# Patient Record
Sex: Male | Born: 2000 | Race: White | Hispanic: No | Marital: Single
Health system: Southern US, Community
[De-identification: ages and names within clinical notes are randomized; demographics above are authoritative.]

## PROBLEM LIST (undated history)

## (undated) DIAGNOSIS — R278 Other lack of coordination: Secondary | ICD-10-CM

## (undated) DIAGNOSIS — F902 Attention-deficit hyperactivity disorder, combined type: Secondary | ICD-10-CM

## (undated) HISTORY — PX: ADENOIDECTOMY: SUR15

## (undated) HISTORY — PX: TYMPANOSTOMY TUBE PLACEMENT: SHX32

## (undated) HISTORY — DX: Attention-deficit hyperactivity disorder, combined type: F90.2

## (undated) HISTORY — PX: WISDOM TOOTH EXTRACTION: SHX21

## (undated) HISTORY — DX: Other lack of coordination: R27.8

---
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
## 2021-01-05 DIAGNOSIS — F4011 Social phobia, generalized: Secondary | ICD-10-CM | POA: Diagnosis not present

## 2021-01-05 DIAGNOSIS — F902 Attention-deficit hyperactivity disorder, combined type: Secondary | ICD-10-CM | POA: Diagnosis not present

## 2021-01-19 DIAGNOSIS — F4011 Social phobia, generalized: Secondary | ICD-10-CM | POA: Diagnosis not present

## 2021-01-19 DIAGNOSIS — F902 Attention-deficit hyperactivity disorder, combined type: Secondary | ICD-10-CM | POA: Diagnosis not present

## 2021-02-09 DIAGNOSIS — F902 Attention-deficit hyperactivity disorder, combined type: Secondary | ICD-10-CM | POA: Diagnosis not present

## 2021-02-09 DIAGNOSIS — F4011 Social phobia, generalized: Secondary | ICD-10-CM | POA: Diagnosis not present

## 2021-03-10 DIAGNOSIS — F4011 Social phobia, generalized: Secondary | ICD-10-CM | POA: Diagnosis not present

## 2021-03-10 DIAGNOSIS — F902 Attention-deficit hyperactivity disorder, combined type: Secondary | ICD-10-CM | POA: Diagnosis not present

## 2021-04-15 DIAGNOSIS — F902 Attention-deficit hyperactivity disorder, combined type: Secondary | ICD-10-CM | POA: Diagnosis not present

## 2021-04-15 DIAGNOSIS — F4011 Social phobia, generalized: Secondary | ICD-10-CM | POA: Diagnosis not present

## 2021-06-02 ENCOUNTER — Telehealth: Payer: Self-pay | Admitting: Internal Medicine

## 2021-06-02 NOTE — Telephone Encounter (Signed)
Appointment entered

## 2021-06-02 NOTE — Telephone Encounter (Signed)
Needs appt at 310 Mon 8/8 - having GERD issues - about to go to college Mom - Dr. Penne Lash texted and they are aware of the appointment

## 2021-06-03 DIAGNOSIS — F4011 Social phobia, generalized: Secondary | ICD-10-CM | POA: Diagnosis not present

## 2021-06-03 DIAGNOSIS — F902 Attention-deficit hyperactivity disorder, combined type: Secondary | ICD-10-CM | POA: Diagnosis not present

## 2021-06-06 ENCOUNTER — Ambulatory Visit (INDEPENDENT_AMBULATORY_CARE_PROVIDER_SITE_OTHER): Payer: BC Managed Care – PPO | Admitting: Internal Medicine

## 2021-06-06 ENCOUNTER — Encounter: Payer: Self-pay | Admitting: Internal Medicine

## 2021-06-06 VITALS — BP 84/56 | HR 72 | Ht 67.5 in | Wt 164.0 lb

## 2021-06-06 DIAGNOSIS — R059 Cough, unspecified: Secondary | ICD-10-CM

## 2021-06-06 NOTE — Progress Notes (Signed)
   Derek Barry 19 y.o. [birth year] [id number]  Assessment & Plan:   Encounter Diagnosis  Name Primary?   Cough after eating Yes   The response to PPI suggest that it could be GERD.  It would be unusual but I suppose possible that EOE could do this.  I am going to have him do a barium swallow and we will make further treatment plans after that.  I suspect going to higher dose PPI versus twice daily 20 mg omeprazole might be the next step.  I had intended to give him a GERD diet handout which we did not do I can send that through MyChart we did briefly discussed limiting caffeine chocolate citrus and tomato-based foods.  Again I am not 100% convinced this is true reflux.  Once I get the barium swallow back we can decide.   Subjective:   Chief Complaint: Cough after eating  HPI The patient is an 20 year old white man who has had problems over the past several months or longer where he has had coughing after eating large meals or drinking a lot.  His pediatrician placed him on omeprazole which we think was the 20 mg dose and it was working but lately he has had some breakthrough symptoms.  He is getting ready to go off to the [employer] in a few days to start college.  His mother had contacted me about these problems and we made this appointment.  He has no dysphagia.  There are no allergy symptoms or postnasal drip.  The cough may be slightly wet when he has it. No Known Allergies Current Meds  Medication Sig   amphetamine-dextroamphetamine (ADDERALL) 20 MG tablet Take 1 tablet by mouth 2 (two) times daily.   clindamycin-benzoyl peroxide (BENZACLIN) gel    sertraline (ZOLOFT) 100 MG tablet TAKE 1 TABLET (100 MG TOTAL) BY MOUTH DAILY.       Past Medical History:  Diagnosis Date   ADHD (attention deficit hyperactivity disorder), combined type 02/01/2016   Dysgraphia 02/01/2016   Past Surgical History:  Procedure Laterality Date   TONSILLECTOMY AND ADENOIDECTOMY   10/31/2007   TYMPANOSTOMY TUBE PLACEMENT     WISDOM TOOTH EXTRACTION     Social History   Social History Narrative   Single no children   Mom Dr. Penne Lash   1 caffeinated drink some days Coke 0   Never smoker no drug use no tobacco   family history includes Celiac disease in his sister; Hypertension in his maternal grandfather and paternal grandfather; Lymphoma (age of onset: 22) in his paternal grandmother; Melanoma in his maternal grandfather; Stroke in his maternal grandfather.   Review of Systems All other review of systems negative  Objective:   Physical Exam BP (!) 84/56 (BP Location: Left Arm, Patient Position: Sitting, Cuff Size: Normal)   Pulse 72   Ht 5' 7.5" (1.715 m) Comment: height measured without shoes  Wt 164 lb (74.4 kg)   BMI 25.31 kg/m  Well-developed well-nourished young white man no acute distress Lungs clear Pharynx is clear Heart sounds are normal S1-S2 no rubs murmurs or gallops Abdomen is soft and nontender without organomegaly or mass Appropriate mood and affect

## 2021-06-06 NOTE — Patient Instructions (Signed)
You have been scheduled for a Barium Esophogram at [facility] (1st floor of the hospital) on 06/10/21 at 11:00am. Please arrive 15 minutes prior to your appointment for registration. Make certain not to have anything to eat or drink 3 hours prior to your test. If you need to reschedule for any reason, please contact radiology at [phone] to do so. __________________________________________________________________ A barium swallow is an examination that concentrates on views of the esophagus. This tends to be a double contrast exam (barium and two liquids which, when combined, create a gas to distend the wall of the oesophagus) or single contrast (non-ionic iodine based). The study is usually tailored to your symptoms so a good history is essential. Attention is paid during the study to the form, structure and configuration of the esophagus, looking for functional disorders (such as aspiration, dysphagia, achalasia, motility and reflux) EXAMINATION You may be asked to change into a gown, depending on the type of swallow being performed. A radiologist and radiographer will perform the procedure. The radiologist will advise you of the type of contrast selected for your procedure and direct you during the exam. You will be asked to stand, sit or lie in several different positions and to hold a small amount of fluid in your mouth before being asked to swallow while the imaging is performed .In some instances you may be asked to swallow barium coated marshmallows to assess the motility of a solid food bolus. The exam can be recorded as a digital or video fluoroscopy procedure. POST PROCEDURE It will take 1-2 days for the barium to pass through your system. To facilitate this, it is important, unless otherwise directed, to increase your fluids for the next 24-48hrs and to resume your normal diet.  This test typically takes about 30 minutes to  perform. __________________________________________________________________________________  I appreciate the opportunity to care for you. Stan Head, MD, [facility]

## 2021-06-06 NOTE — Progress Notes (Signed)
Phone: [phone]   Subjective:  Patient presents today to establish care and for annual physical.  Prior patient of Dr. Hyacinth [facility] pediatrics.  Chief Complaint  Patient presents with   New Patient (Initial Visit)    Establishing care    See problem oriented charting  The following were reviewed and entered/updated in epic: Past Medical History:  Diagnosis Date   ADHD (attention deficit hyperactivity disorder), combined type 02/01/2016   Patient Active Problem List   Diagnosis Date Noted   Cough after eating 06/06/2021    Priority: Medium   Generalized anxiety disorder 05/03/2016    Priority: Medium   Attention deficit disorder (ADD) in adult 02/01/2016    Priority: Medium   Past Surgical History:  Procedure Laterality Date   TONSILLECTOMY AND ADENOIDECTOMY  10/31/2007   TYMPANOSTOMY TUBE PLACEMENT     believes at younger age   WISDOM TOOTH EXTRACTION      Family History  Problem Relation Age of Onset   Healthy Mother    Healthy Father    Celiac disease Sister    Healthy Brother    Dementia Maternal Grandmother    Stroke Maternal Grandfather    Hypertension Maternal Grandfather    Melanoma Maternal Grandfather    Prostate cancer Maternal Grandfather        ? age still living at 19 per patient   Lymphoma Paternal Grandmother 63   Hypertension Paternal Grandfather    Heart attack Paternal Grandfather        passed in 42s    Medications- reviewed and updated Current Outpatient Medications  Medication Sig Dispense Refill   amphetamine-dextroamphetamine (ADDERALL) 20 MG tablet Take 1 tablet by mouth 2 (two) times daily.     clindamycin-benzoyl peroxide (BENZACLIN) gel   6   DOXYCYCLINE PO Take by mouth daily. Patient is unsure of the dosage.     sertraline (ZOLOFT) 100 MG tablet TAKE 1 TABLET (100 MG TOTAL) BY MOUTH DAILY. 90 tablet 0   No current facility-administered medications for this visit.    Allergies-reviewed and updated No Known  Allergies  Social History   Social History Narrative   Single. At home with mom Dr. Penne Lash of ob/gyn,  dad, brother Romeo Apple 53, sister Alan Ripper 12 in 2022.       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  HENT:  Negative for ear discharge, ear pain and hearing loss.   Eyes:  Negative for blurred vision and double vision.  Respiratory:  Positive for cough (after eating- working with Dr. Leone Payor). Negative for shortness of breath.   Cardiovascular:  Negative for chest pain and palpitations.  Gastrointestinal:  Negative for abdominal pain, constipation, diarrhea, heartburn, nausea and vomiting.  Genitourinary:  Negative for dysuria and frequency.  Musculoskeletal:  Negative for back pain, joint pain and myalgias.  Skin:  Negative for itching and rash.  Neurological:  Negative for dizziness and headaches.  Endo/Heme/Allergies:  Negative for polydipsia. Does not bruise/bleed easily.  Psychiatric/Behavioral:  Negative for depression and suicidal ideas.    The following were reviewed and entered/updated in epic: Past Medical History:  Diagnosis Date   ADHD (attention  deficit hyperactivity disorder), combined type 02/01/2016   Patient Active Problem List   Diagnosis Date Noted   Cough after eating 06/06/2021    Priority: Medium   Generalized anxiety disorder 05/03/2016    Priority: Medium   Attention deficit disorder (ADD) in adult 02/01/2016    Priority: Medium   Past Surgical History:  Procedure Laterality Date   TONSILLECTOMY AND ADENOIDECTOMY  10/31/2007   TYMPANOSTOMY TUBE PLACEMENT     believes at younger age   WISDOM TOOTH EXTRACTION       Family History  Problem Relation Age of Onset   Healthy Mother    Healthy Father    Celiac disease Sister    Healthy Brother    Dementia Maternal Grandmother    Stroke Maternal Grandfather    Hypertension Maternal Grandfather    Melanoma Maternal Grandfather    Prostate cancer Maternal Grandfather        ? age still living at 68 per patient   Lymphoma Paternal Grandmother 25   Hypertension Paternal Grandfather    Heart attack Paternal Grandfather        passed in 3s    Medications- reviewed and updated Current Outpatient Medications  Medication Sig Dispense Refill   amphetamine-dextroamphetamine (ADDERALL) 20 MG tablet Take 1 tablet by mouth 2 (two) times daily.     clindamycin-benzoyl peroxide (BENZACLIN) gel   6   DOXYCYCLINE PO Take by mouth daily. Patient is unsure of the dosage.     sertraline (ZOLOFT) 100 MG tablet TAKE 1 TABLET (100 MG TOTAL) BY MOUTH DAILY. 90 tablet 0   No current facility-administered medications for this visit.    Allergies-reviewed and updated No Known Allergies  Social History   Social History Narrative   Single. At home with mom Dr. Penne Lash of ob/gyn,  dad, brother Romeo Apple 33, sister Alan Ripper 12 in 2022.       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 Oropharynx normal Neck: no thyromegaly CV: RRR no murmurs rubs or gallops Lungs: CTAB no crackles, wheeze, rhonchi Abdomen: soft/nontender/nondistended/normal bowel sounds. No rebound or guarding.  Ext: no edema Skin: warm,  dry Neuro: grossly normal, moves all extremities, PERRLA     Assessment and Plan:  20 y.o. male presenting for annual physical.  Health Maintenance counseling: 1. Anticipatory guidance: Patient counseled regarding regular dental exams -q6 months Dr. 15, eye exams - yearly,  avoiding smoking and second hand smoke , limiting alcohol to 2 beverages per day- not drinking at present which is ideal due to age under 55.   2. Risk factor reduction:  Advised patient of need for regular exercise and diet rich and fruits and vegetables to reduce risk of heart attack and stroke. Exercise- no regular regimen- enjoys walks and bike rides- encouraged to restart. Diet-reasonably healthy.  Wt Readings from Last 3 Encounters:  06/08/21 163 lb 3.2 oz (74 kg) (68 %, Z= 0.47)*  06/06/21 164  lb (74.4 kg) (69 %, Z= 0.50)*  12/15/19 149 lb (67.6 kg) (59 %, Z= 0.23)*   * Growth percentiles are based on CDC (Boys, 2-20 Years) data.  3. Immunizations/screenings/ancillary studies- up to date- will try to get copy of HPV likely through [facility] Immunization History  Administered Date(s) Administered   Influenza,inj,Quad PF,6+ Mos 10/28/2014, 09/07/2015, 08/03/2016, 07/13/2017   PFIZER(Purple Top)SARS-COV-2 Vaccination 01/16/2020, 02/10/2020, 10/19/2020  4. Prostate cancer screening- no family history in first degree relative (grandfather had prostate cancer), start at age 32  5. Colon cancer screening - no family history, start at age 62 6. Skin cancer screening/prevention- no dermatologist. advised regular sunscreen use. Denies worrisome, changing, or new skin lesions.  7. Testicular cancer screening- advised monthly self exams  8. STD screening- patient opts out- abstinence 9. Never smoker-   Status of chronic or acute concerns   #Social update-recently got back from a trip to [place] which he enjoyed   # cough S:Patient was presented for an office visit with Dr. Leone Payor for an evaluation of a cough on  06/06/2021. He reported to have problems over the past several months or longer with coughing after eating large meals or drinking a lot. The cough is slightly wet. His pediatrician placed him on omeprazole, which they thought was working but had some breakthrough symptoms. Denied dysphagia or allergy symptoms or postnasal drip.   A/P: Possible GERD?  Patient has been set up with a barium swallow study for Friday to obtain more information-there is some consideration of increasing omeprazole-we will await expert consult/information from Dr. Leone Payor  # cervical myofascial strain- related to backpacking as an [employer]-in 2021-no recent issues  #Acne-patient states currently on doxycycline but has not picked up his BenzaClin recently-we discussed typically these (benzyl peroxide and antibiotic) are used in combination to reduce antibiotic resistance-recommended he restart BenzaClin-I am open to sending these in if needed but he did not need these today  #GAD-well managed by psychiatry-patient reports doing well on Zoloft 100 mg  #ADD-also managed by psychiatry in [place]-patient reports good control on Adderall twice daily 20 mg  Recommended follow up: Return in about 1 year (around 06/08/2022) for physical or sooner if needed. Future Appointments  Date Time Provider Department Center  06/10/2021 11:00 AM [facility] 1 [facility] [place]    Lab/Order associations: fasting   ICD-10-CM   1. Preventative health care  Z00.00     2. Generalized anxiety disorder  F41.1     3. Attention deficit disorder (ADD) in adult  F98.8       Return precautions advised.  Tana Conch, MD

## 2021-06-08 ENCOUNTER — Other Ambulatory Visit: Payer: Self-pay

## 2021-06-08 ENCOUNTER — Encounter: Payer: Self-pay | Admitting: Family Medicine

## 2021-06-08 ENCOUNTER — Ambulatory Visit (INDEPENDENT_AMBULATORY_CARE_PROVIDER_SITE_OTHER): Payer: BC Managed Care – PPO | Admitting: Family Medicine

## 2021-06-08 VITALS — BP 97/61 | HR 67 | Temp 98.7°F | Ht 68.0 in | Wt 163.2 lb

## 2021-06-08 DIAGNOSIS — F411 Generalized anxiety disorder: Secondary | ICD-10-CM | POA: Diagnosis not present

## 2021-06-08 DIAGNOSIS — Z0000 Encounter for general adult medical examination without abnormal findings: Secondary | ICD-10-CM

## 2021-06-08 DIAGNOSIS — F988 Other specified behavioral and emotional disorders with onset usually occurring in childhood and adolescence: Secondary | ICD-10-CM

## 2021-06-08 NOTE — Patient Instructions (Addendum)
Health Maintenance Due  Topic Date Due   HPV VACCINES (1 - Male 2-dose series)   Team please check on this in [facility] Never done   Hepatitis C Screening  Consider if we do labs in future Never done   INFLUENZA VACCINE   Would recommend in the fall- please let us know when you get this 05/30/2021   Great to meet you! Wish you the best in [place]  Recommended follow up: Return in about 1 year (around 06/08/2022) for physical or sooner if needed.

## 2021-06-10 ENCOUNTER — Ambulatory Visit (HOSPITAL_COMMUNITY)
Admission: RE | Admit: 2021-06-10 | Discharge: 2021-06-10 | Disposition: A | Discharge status: Home / Self Care | Payer: BC Managed Care – PPO | Source: Ambulatory Visit | Attending: Internal Medicine | Admitting: Internal Medicine

## 2021-06-10 ENCOUNTER — Other Ambulatory Visit (HOSPITAL_COMMUNITY): Payer: Self-pay

## 2021-06-10 ENCOUNTER — Other Ambulatory Visit: Payer: Self-pay

## 2021-06-10 ENCOUNTER — Other Ambulatory Visit: Payer: Self-pay | Admitting: Internal Medicine

## 2021-06-10 DIAGNOSIS — K449 Diaphragmatic hernia without obstruction or gangrene: Secondary | ICD-10-CM | POA: Diagnosis not present

## 2021-06-10 DIAGNOSIS — R059 Cough, unspecified: Secondary | ICD-10-CM | POA: Insufficient documentation

## 2021-06-10 DIAGNOSIS — K219 Gastro-esophageal reflux disease without esophagitis: Secondary | ICD-10-CM | POA: Diagnosis not present

## 2021-06-10 MED ORDER — OMEPRAZOLE 40 MG PO CPDR
40.0000 mg | DELAYED_RELEASE_CAPSULE | Freq: Every day | ORAL | 3 refills | Status: DC
  Filled 2021-06-10: qty 30, 30d supply, fill #0

## 2021-06-13 ENCOUNTER — Other Ambulatory Visit (HOSPITAL_COMMUNITY): Payer: Self-pay

## 2021-09-25 ENCOUNTER — Encounter: Payer: Self-pay | Admitting: Family Medicine

## 2021-09-27 ENCOUNTER — Other Ambulatory Visit: Payer: Self-pay

## 2021-09-27 MED ORDER — OMEPRAZOLE 40 MG PO CPDR: 40.0000 mg | DELAYED_RELEASE_CAPSULE | Freq: Every day | ORAL | 3 refills | Status: DC

## 2021-10-27 DIAGNOSIS — F902 Attention-deficit hyperactivity disorder, combined type: Secondary | ICD-10-CM | POA: Diagnosis not present

## 2021-10-27 DIAGNOSIS — F4011 Social phobia, generalized: Secondary | ICD-10-CM | POA: Diagnosis not present

## 2021-10-28 DIAGNOSIS — Z13811 Encounter for screening for lower gastrointestinal disorder: Secondary | ICD-10-CM | POA: Diagnosis not present

## 2021-10-28 DIAGNOSIS — Z8379 Family history of other diseases of the digestive system: Secondary | ICD-10-CM | POA: Diagnosis not present

## 2021-11-07 DIAGNOSIS — Z13811 Encounter for screening for lower gastrointestinal disorder: Secondary | ICD-10-CM | POA: Diagnosis not present

## 2021-11-07 DIAGNOSIS — Z8379 Family history of other diseases of the digestive system: Secondary | ICD-10-CM | POA: Diagnosis not present

## 2022-03-20 ENCOUNTER — Ambulatory Visit: Payer: BC Managed Care – PPO | Admitting: Podiatry

## 2022-03-21 ENCOUNTER — Other Ambulatory Visit (HOSPITAL_COMMUNITY): Payer: Self-pay

## 2022-03-21 ENCOUNTER — Ambulatory Visit (INDEPENDENT_AMBULATORY_CARE_PROVIDER_SITE_OTHER): Payer: BC Managed Care – PPO | Admitting: Podiatry

## 2022-03-21 ENCOUNTER — Encounter: Payer: Self-pay | Admitting: Podiatry

## 2022-03-21 DIAGNOSIS — H52202 Unspecified astigmatism, left eye: Secondary | ICD-10-CM | POA: Diagnosis not present

## 2022-03-21 DIAGNOSIS — L600 Ingrowing nail: Secondary | ICD-10-CM

## 2022-03-21 MED ORDER — DOXYCYCLINE HYCLATE 100 MG PO TABS
100.0000 mg | ORAL_TABLET | Freq: Two times a day (BID) | ORAL | 0 refills | Status: DC
  Filled 2022-03-21: qty 20, 10d supply, fill #0

## 2022-03-21 NOTE — Patient Instructions (Signed)

## 2022-03-21 NOTE — Progress Notes (Addendum)
   Subjective: Patient presents today for evaluation of pain to the lateral border left great toe. Patient is concerned for possible ingrown nail.  It is very sensitive to touch.  Patient presents today for further treatment and evaluation.  Past Medical History:  Diagnosis Date   ADHD (attention deficit hyperactivity disorder), combined type 02/01/2016    Objective:  General: Well developed, nourished, in no acute distress, alert and oriented x3   Dermatology: Skin is warm, dry and supple bilateral.  Lateral border left great toe appears to be erythematous with evidence of an ingrowing nail. Pain on palpation noted to the border of the nail fold. The remaining nails appear unremarkable at this time. There are no open sores, lesions.  Vascular: Dorsalis Pedis artery and Posterior Tibial artery pedal pulses palpable. No lower extremity edema noted.   Neruologic: Grossly intact via light touch bilateral.  Musculoskeletal: Muscular strength within normal limits in all groups bilateral. Normal range of motion noted to all pedal and ankle joints.   Assesement: #1 Paronychia with ingrowing nail lateral border left great toe #2 Pain in toe  Plan of Care:  1. Patient evaluated.  2. Discussed treatment alternatives and plan of care. Explained nail avulsion procedure and post procedure course to patient. 3. Patient opted for permanent partial nail avulsion of the ingrown portion of the nail.  4. Prior to procedure, local anesthesia infiltration utilized using 3 ml of a 50:50 mixture of 2% plain lidocaine and 0.5% plain marcaine in a normal hallux block fashion and a betadine prep performed.  5. Partial permanent nail avulsion with chemical matrixectomy performed using 3x30sec applications of phenol followed by alcohol flush.  6. Light dressing applied.  Post care instructions provided 7.  Patient has a prescription for gentamicin 2% cream at home.  Apply daily with a Band-Aid 8.  Prescription  for doxycycline 100 mg 2 times daily #20  9.  Return to clinic 2 weeks.  Felecia Shelling, DPM [facility]  Dr. Felecia Shelling, DPM    [address] [place], [place] [address]                Office [phone]  Fax [phone]

## 2022-03-22 ENCOUNTER — Encounter: Payer: Self-pay | Admitting: Family Medicine

## 2022-03-22 ENCOUNTER — Ambulatory Visit (INDEPENDENT_AMBULATORY_CARE_PROVIDER_SITE_OTHER): Payer: BC Managed Care – PPO | Admitting: Family Medicine

## 2022-03-22 DIAGNOSIS — L700 Acne vulgaris: Secondary | ICD-10-CM

## 2022-03-22 DIAGNOSIS — L709 Acne, unspecified: Secondary | ICD-10-CM | POA: Insufficient documentation

## 2022-03-22 MED ORDER — CLINDAMYCIN PHOS-BENZOYL PEROX 1-5 % EX GEL: Freq: Two times a day (BID) | CUTANEOUS | 5 refills | Status: DC

## 2022-03-22 NOTE — Progress Notes (Signed)
Phone [phone] In person visit   Subjective:   Derek Barry is a 21 y.o. year old very pleasant male patient who presents for/with See problem oriented charting Chief Complaint  Patient presents with   Acne    Pt has he used doxycycline in the past which has helped his acne and he would like to get this renewed.    Past Medical History-  Patient Active Problem List   Diagnosis Date Noted   Cough after eating 06/06/2021    Priority: Medium    Generalized anxiety disorder 05/03/2016    Priority: Medium    Attention deficit disorder (ADD) in adult 02/01/2016    Priority: Medium    Acne 03/22/2022    Medications- reviewed and updated Current Outpatient Medications  Medication Sig Dispense Refill   amphetamine-dextroamphetamine (ADDERALL) 20 MG tablet Take 1 tablet by mouth 2 (two) times daily.     clindamycin-benzoyl peroxide (BENZACLIN) gel Apply topically 2 (two) times daily. 25 g 5   doxycycline (VIBRA-TABS) 100 MG tablet Take 1 tablet (100 mg total) by mouth 2 (two) times daily. 20 tablet 0   omeprazole (PRILOSEC) 40 MG capsule Take 1 capsule (40 mg total) by mouth daily about 30 minutes before breakfast. 90 capsule 3   sertraline (ZOLOFT) 100 MG tablet TAKE 1 TABLET (100 MG TOTAL) BY MOUTH DAILY. 90 tablet 0   No current facility-administered medications for this visit.     Objective:  BP 120/80   Pulse 69   Temp 98.1 F (36.7 C)   Ht 5\' 8"  (1.727 m)   Wt 161 lb 6.4 oz (73.2 kg)   SpO2 98%   BMI 24.54 kg/m  Gen: NAD, resting comfortably CV: RRR no murmurs rubs or gallops Lungs: CTAB no crackles, wheeze, rhonchi Skin: warm, dry, some comedones on nose, multiple papules and some pustules on the face-patient denies issues outside of the face     Assessment and Plan   #Acne S: several years ago patient was on doxycycline for years on his face and did much better- when ran out his symptoms worsened. Dr previously prescribed.  Reports worsening  symptoms since medication ran out-has some pustules and papules-recently scars.  Also has comedones  Not on any regular topicals.   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  You had really good success with doxycycline in the past and we discussed this as an option in addition to the above if needed or if you simply do not tolerate the above we can use as an alternate -Could consider topical retinoid as well -In addition appears was recently prescribed doxycycline by podiatry  Recommended follow up: Return for as needed for new, worsening, persistent symptoms.  Also recommended scheduling a physical Future Appointments  Date Time Provider Department Center  04/10/2022  1:15 PM 06/10/2022, DPM [facility]   Lab/Order associations:   ICD-10-CM   1. Acne vulgaris  L70.0       Meds ordered this encounter  Medications   clindamycin-benzoyl peroxide (BENZACLIN) gel    Sig: Apply topically 2 (two) times daily.    Dispense:  25 g    Refill:  5    Return precautions advised.  Felecia Shelling,  MD

## 2022-03-22 NOTE — Patient Instructions (Addendum)
-  we know you have not had the best success with consistently taking topicals in the past-lets try and consider using alarms to help remember-we will trial BenzaClin twice daily for the next 3 to 6 months (make sure to use white towels).  You had really good success with doxycycline in the past and we discussed this as an option in addition to the above if needed or if you simply do not tolerate the above we can use as an alternate  Recommended follow up: Return for as needed for new, worsening, persistent symptoms. -happy to check in within 3-6 months if you would like or could schedule next available physical after august 10th 2023

## 2022-04-10 ENCOUNTER — Ambulatory Visit (INDEPENDENT_AMBULATORY_CARE_PROVIDER_SITE_OTHER): Payer: BC Managed Care – PPO | Admitting: Podiatry

## 2022-04-10 DIAGNOSIS — L600 Ingrowing nail: Secondary | ICD-10-CM

## 2022-04-10 NOTE — Progress Notes (Signed)
   HPI: 21 y.o. male presenting today for follow-up evaluation of a partial permanent nail avulsion to the lateral border of the left great toe performed on 03/21/2022.  Patient doing well.  He says that it feels much better.  He applied antibiotic cream and take the doxycycline as instructed.  No new complaints at this time  Past Medical History:  Diagnosis Date   ADHD (attention deficit hyperactivity disorder), combined type 02/01/2016    Past Surgical History:  Procedure Laterality Date   TONSILLECTOMY AND ADENOIDECTOMY  10/31/2007   TYMPANOSTOMY TUBE PLACEMENT     believes at younger age   WISDOM TOOTH EXTRACTION      No Known Allergies   Physical Exam: General: The patient is alert and oriented x3 in no acute distress.  Dermatology: Skin is warm, dry and supple bilateral lower extremities. Negative for open lesions or macerations.  The nail matricectomy site to the lateral border left great toe has healed completely.  No drainage.  No erythema.  Well-healed nail matricectomy site  Vascular: Palpable pedal pulses bilaterally. Capillary refill within normal limits.  Negative for any significant edema or erythema  Neurological: Light touch and protective threshold grossly intact  Musculoskeletal Exam: No pedal deformities noted   Assessment: 1.  Partial nail matricectomy lateral border left great toe   Plan of Care:  1. Patient evaluated.  2.  Patient may resume full activity no restrictions 3.  Return to clinic as needed     Felecia Shelling, DPM [facility]  Dr. Felecia Shelling, DPM    [address] [place], [place] [address]                Office [phone]  Fax [phone]

## 2022-04-27 DIAGNOSIS — F902 Attention-deficit hyperactivity disorder, combined type: Secondary | ICD-10-CM | POA: Diagnosis not present

## 2022-04-27 DIAGNOSIS — F4011 Social phobia, generalized: Secondary | ICD-10-CM | POA: Diagnosis not present

## 2022-05-22 DIAGNOSIS — F4011 Social phobia, generalized: Secondary | ICD-10-CM | POA: Diagnosis not present

## 2022-05-22 DIAGNOSIS — F902 Attention-deficit hyperactivity disorder, combined type: Secondary | ICD-10-CM | POA: Diagnosis not present

## 2022-07-24 ENCOUNTER — Encounter: Payer: Self-pay | Admitting: *Deleted

## 2022-08-24 ENCOUNTER — Encounter: Payer: Self-pay | Admitting: Family Medicine

## 2022-08-24 ENCOUNTER — Other Ambulatory Visit: Payer: Self-pay

## 2022-08-24 MED ORDER — OMEPRAZOLE 40 MG PO CPDR: 40.0000 mg | DELAYED_RELEASE_CAPSULE | Freq: Every day | ORAL | 3 refills | Status: DC

## 2022-10-12 ENCOUNTER — Encounter: Payer: Self-pay | Admitting: *Deleted

## 2022-10-19 DIAGNOSIS — F902 Attention-deficit hyperactivity disorder, combined type: Secondary | ICD-10-CM | POA: Diagnosis not present

## 2022-10-19 DIAGNOSIS — F4011 Social phobia, generalized: Secondary | ICD-10-CM | POA: Diagnosis not present

## 2022-10-24 ENCOUNTER — Other Ambulatory Visit (HOSPITAL_BASED_OUTPATIENT_CLINIC_OR_DEPARTMENT_OTHER): Payer: Self-pay

## 2022-10-24 MED ORDER — COMIRNATY 30 MCG/0.3ML IM SUSY
PREFILLED_SYRINGE | INTRAMUSCULAR | 0 refills | Status: DC
  Filled 2022-10-24: qty 0.3, 1d supply, fill #0

## 2022-10-27 ENCOUNTER — Other Ambulatory Visit (HOSPITAL_BASED_OUTPATIENT_CLINIC_OR_DEPARTMENT_OTHER): Payer: Self-pay

## 2022-10-27 MED ORDER — FLUARIX QUADRIVALENT 0.5 ML IM SUSY
PREFILLED_SYRINGE | INTRAMUSCULAR | 0 refills | Status: DC
  Filled 2022-10-27: qty 0.5, 1d supply, fill #0

## 2022-12-28 IMAGING — RF DG ESOPHAGUS
8 of 9 series · 17 of 24 positions shown · non-contrast
Comparison: None.

CLINICAL DATA: Cough after eating and drinking.

EXAM:
ESOPHOGRAM / BARIUM SWALLOW / BARIUM TABLET STUDY
TECHNIQUE: Combined double contrast and single contrast examination performed
using effervescent crystals, thick barium liquid, and thin barium
liquid. The patient was observed with fluoroscopy swallowing a 13 mm
barium sulphate tablet.
FLUOROSCOPY TIME:  Fluoroscopy Time:  1 minutes 42 second
Radiation Exposure Index (if provided by the fluoroscopic device):
Number of Acquired Spot Images: 9

[Series 1: cp_standard · 0.40mm/px · 2 of 103 frames shown (1 of 8)]
[frame 16/103]
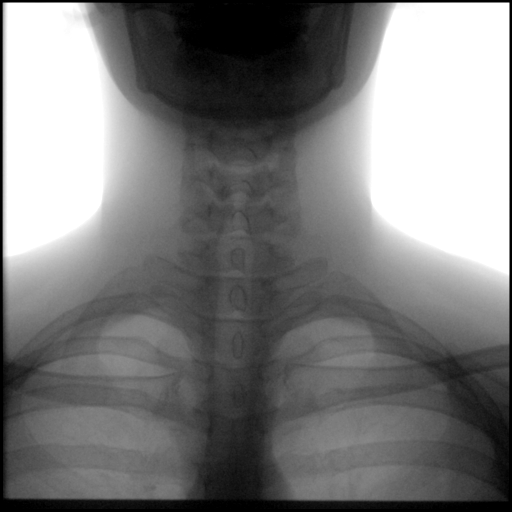
[frame 88/103]
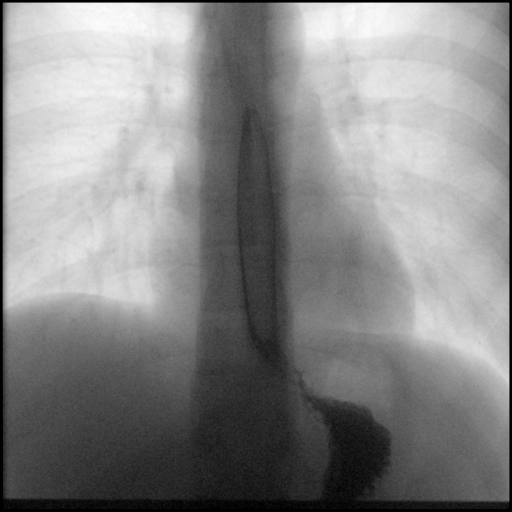

[Series 2: cp_standard · 0.37mm/px · 2 of 95 frames shown (2 of 8)]
[frame 15/95]
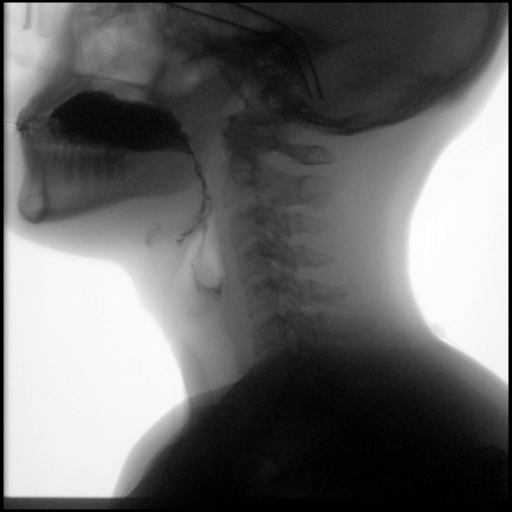
[frame 48/95]
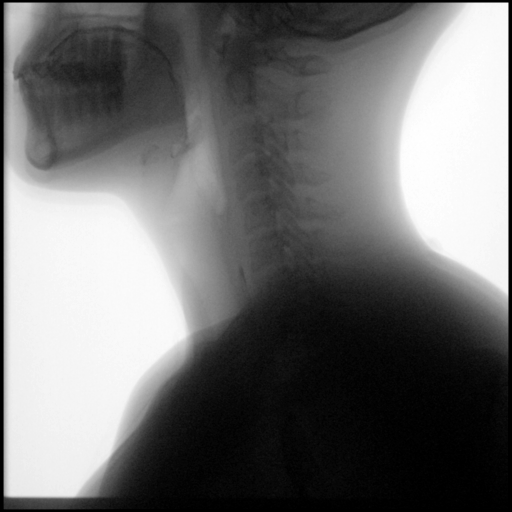

[Series 3: cp_standard · 0.39mm/px · 3 of 128 frames shown (3 of 8)]
[frame 20/128]
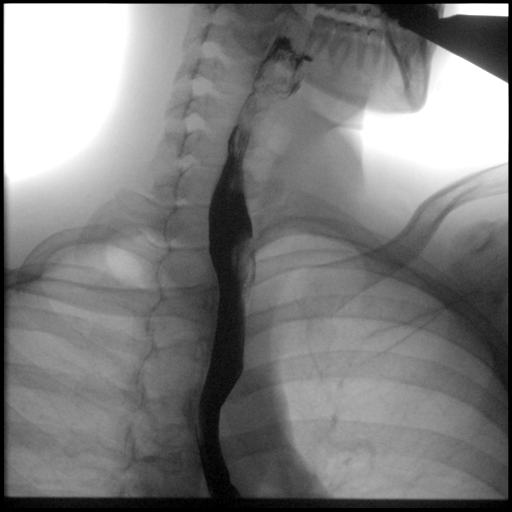
[frame 55/128]
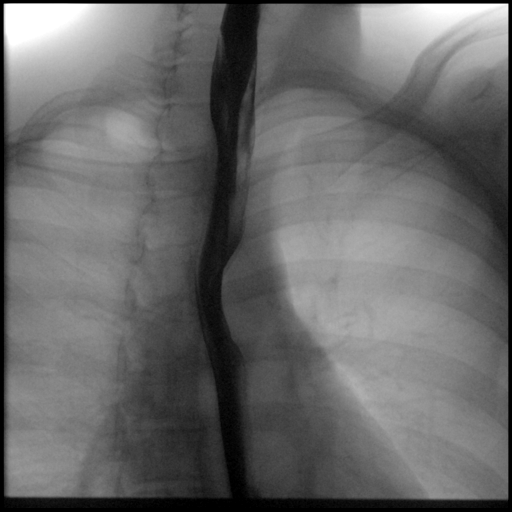
[frame 109/128]
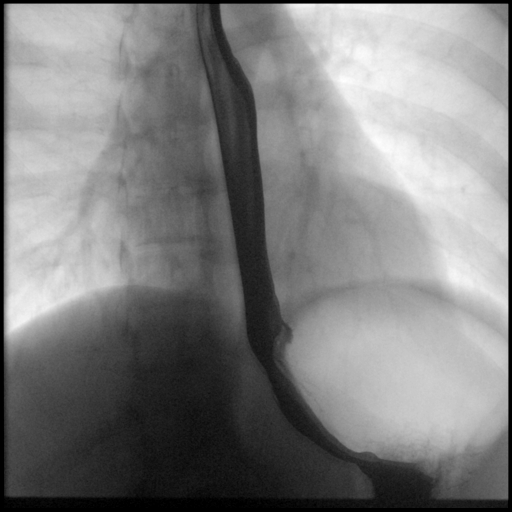

[Series 4: cp_standard · 0.38mm/px · 2 of 74 frames shown (4 of 8)]
[frame 31/74]
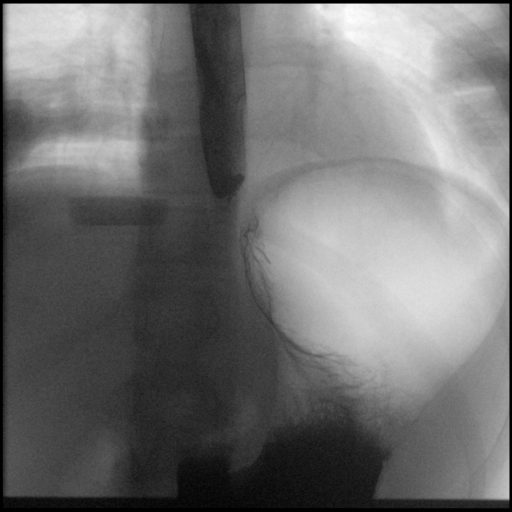
[frame 63/74]
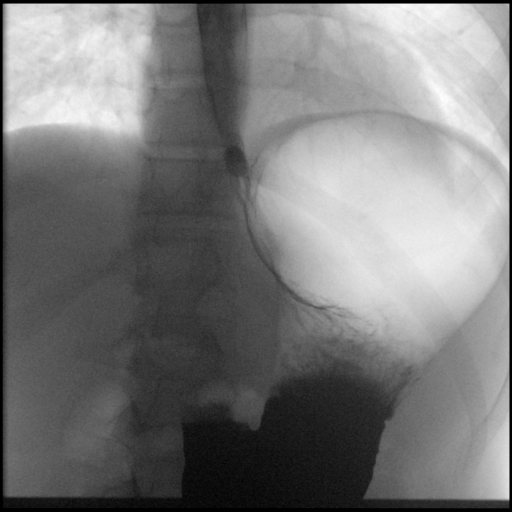

[Series 5: cp_standard · 0.35mm/px · 2 of 130 frames shown (5 of 8)]
[frame 20/130]
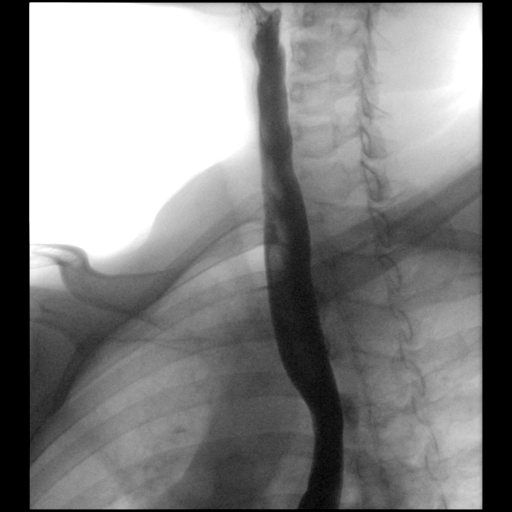
[frame 66/130]
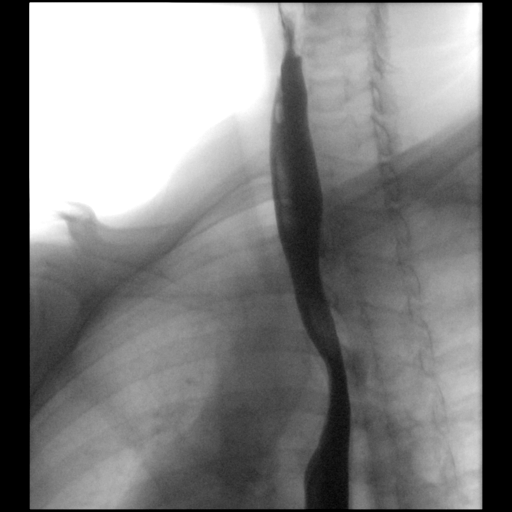

[Series 6: cp_standard · 0.35mm/px · 2 of 97 frames shown (6 of 8)]
[frame 15/97]
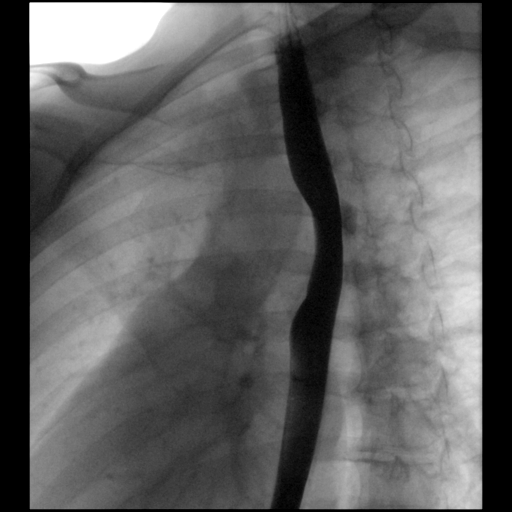
[frame 49/97]
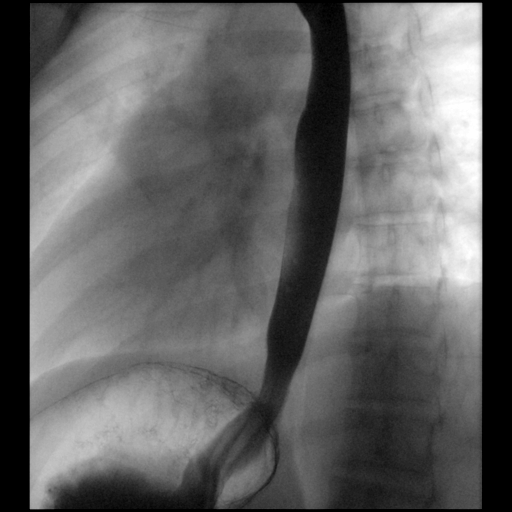

[Series 7: cp_standard · 0.36mm/px · 3 of 42 frames shown (7 of 8)]
[frame 7/42]
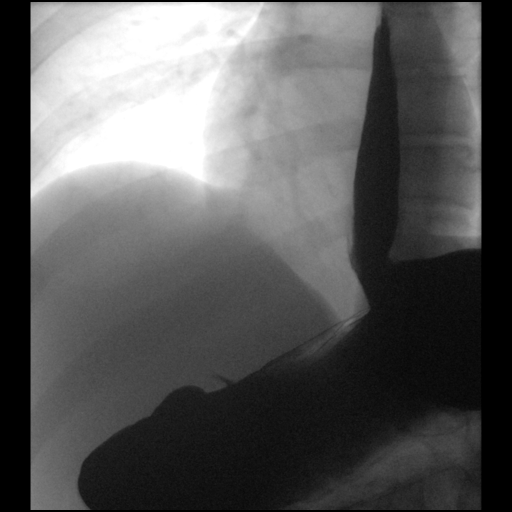
[frame 16/42]
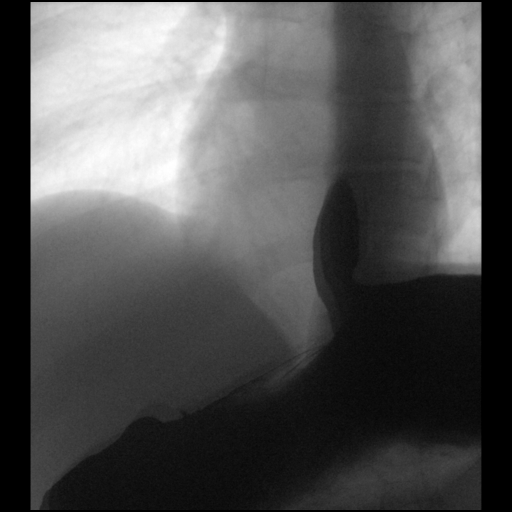
[frame 22/42]
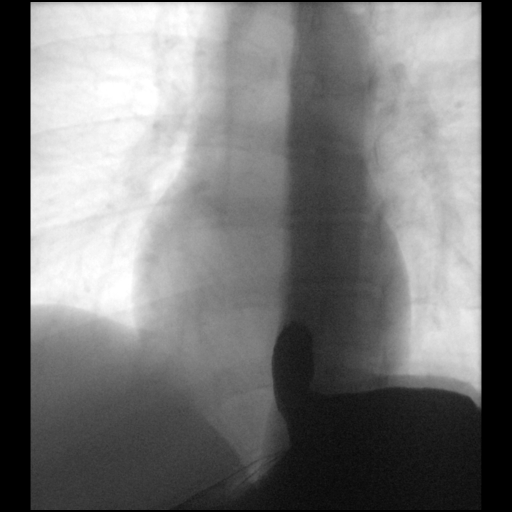

[Series 9: cp_standard · 0.18mm/px · 1 of 1 slices shown (8 of 8)]
[im 1/1]
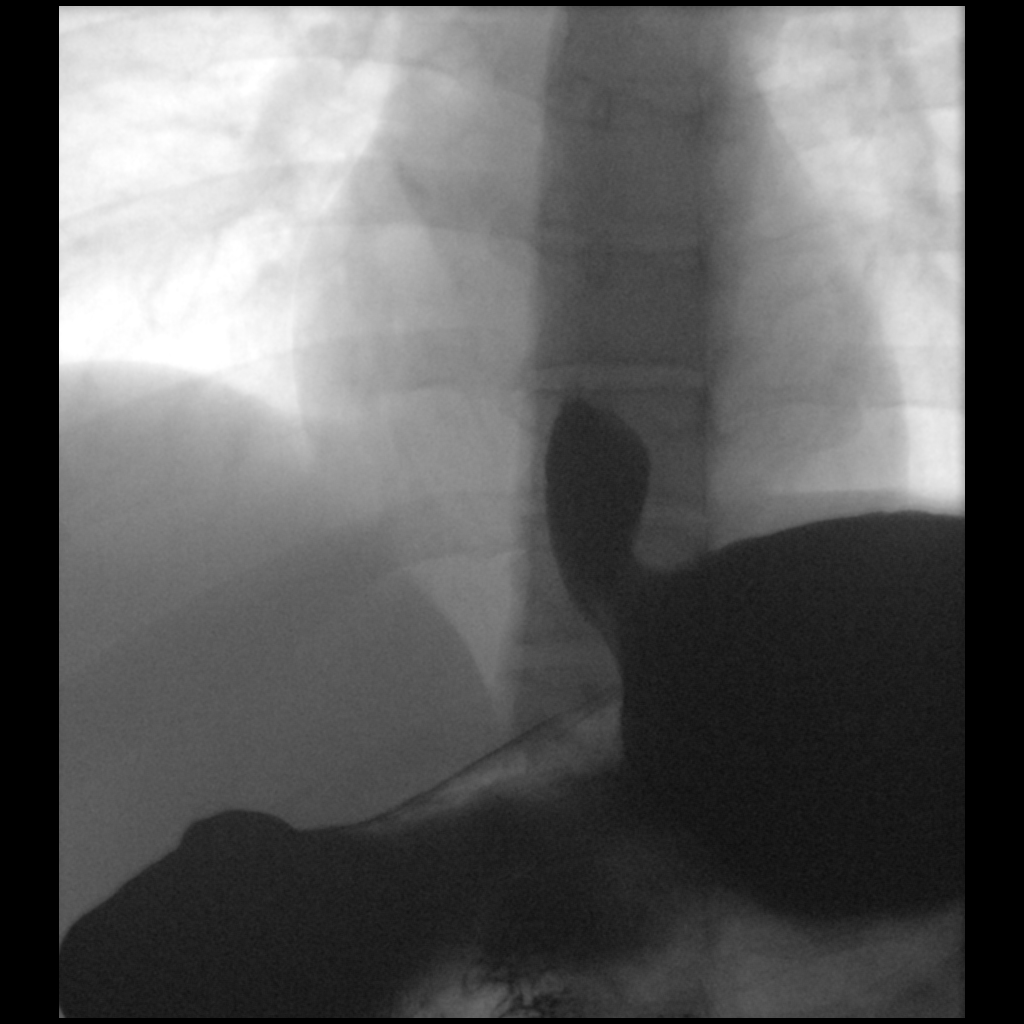

[17 of 24 positions shown; findings below may reference images not displayed]

FINDINGS: Pharyngeal phase of swallowing is normal. No aspiration. No
pharyngeal diverticulum.

Esophageal mucosa and motility normal.  No stricture or mass lesion.

Small hiatal hernia.  Moderate gastroesophageal reflux.

Barium tablet passed readily into the stomach.
IMPRESSION: Negative for aspiration.  Normal pharyngeal function.

Small hiatal hernia with gastroesophageal reflux. Negative for
esophageal stricture.

## 2023-03-16 ENCOUNTER — Encounter: Payer: Self-pay | Admitting: Family Medicine

## 2023-03-16 DIAGNOSIS — F902 Attention-deficit hyperactivity disorder, combined type: Secondary | ICD-10-CM | POA: Diagnosis not present

## 2023-03-16 DIAGNOSIS — F411 Generalized anxiety disorder: Secondary | ICD-10-CM | POA: Diagnosis not present

## 2023-03-19 ENCOUNTER — Ambulatory Visit (INDEPENDENT_AMBULATORY_CARE_PROVIDER_SITE_OTHER): Payer: BC Managed Care – PPO | Admitting: Family Medicine

## 2023-03-19 VITALS — BP 120/72 | HR 78 | Temp 97.0°F | Ht 68.0 in | Wt 167.4 lb

## 2023-03-19 DIAGNOSIS — Z0000 Encounter for general adult medical examination without abnormal findings: Secondary | ICD-10-CM | POA: Diagnosis not present

## 2023-03-19 DIAGNOSIS — Z23 Encounter for immunization: Secondary | ICD-10-CM

## 2023-03-19 MED ORDER — CLINDAMYCIN PHOS-BENZOYL PEROX 1-5 % EX GEL: Freq: Two times a day (BID) | CUTANEOUS | 5 refills | Status: AC

## 2023-03-19 NOTE — Addendum Note (Signed)
Addended by: Shelva Majestic on: 03/19/2023 02:35 PM   Modules accepted: Level of Service

## 2023-03-19 NOTE — Patient Instructions (Addendum)
Tetanus, Diphtheria, and Pertussis (Tdap) today  Recommended follow up: Return in about 1 year (around 03/18/2024) for physical or sooner if needed.Schedule b4 you leave.

## 2023-03-19 NOTE — Addendum Note (Signed)
Addended by: Gwenette Greet on: 03/19/2023 02:40 PM   Modules accepted: Orders

## 2023-03-19 NOTE — Progress Notes (Signed)
Phone: [phone]    Subjective:  Patient presents today for their annual physical. Chief complaint-noted.   See problem oriented charting- ROS- full  review of systems was completed and negative  Per full ROS sheet completed by patient  The following were reviewed and entered/updated in epic: Past Medical History:  Diagnosis Date   ADHD (attention deficit hyperactivity disorder), combined type 02/01/2016   Patient Active Problem List   Diagnosis Date Noted   Cough after eating 06/06/2021    Priority: Medium    Generalized anxiety disorder 05/03/2016    Priority: Medium    Attention deficit disorder (ADD) in adult 02/01/2016    Priority: Medium    Acne 03/22/2022   Past Surgical History:  Procedure Laterality Date   TONSILLECTOMY AND ADENOIDECTOMY  10/31/2007   TYMPANOSTOMY TUBE PLACEMENT     believes at younger age   WISDOM TOOTH EXTRACTION      Family History  Problem Relation Age of Onset   Healthy Mother    Healthy Father    Celiac disease Sister    Healthy Brother    Dementia Maternal Grandmother    Stroke Maternal Grandfather    Hypertension Maternal Grandfather    Melanoma Maternal Grandfather    Prostate cancer Maternal Grandfather        ? age still living at 37 per patient   Lymphoma Paternal Grandmother 50   Hypertension Paternal Grandfather    Heart attack Paternal Grandfather        passed in 81s    Medications- reviewed and updated Current Outpatient Medications  Medication Sig Dispense Refill   amphetamine-dextroamphetamine (ADDERALL) 20 MG tablet Take 1 tablet by mouth 2 (two) times daily.     omeprazole (PRILOSEC) 40 MG capsule Take 1 capsule (40 mg total) by mouth daily about 30 minutes before breakfast. 90 capsule 3   sertraline (ZOLOFT) 100 MG tablet TAKE 1 TABLET (100 MG TOTAL) BY MOUTH DAILY. 90 tablet 0   clindamycin-benzoyl peroxide (BENZACLIN) gel Apply topically 2 (two) times daily. 25 g 5   No current facility-administered  medications for this visit.    Allergies-reviewed and updated No Known Allergies  Social History   Social History Narrative   Single. At home with mom Dr. Penne Lash of ob/gyn,  dad, brother Romeo Apple 75, sister Alan Ripper 12 in 2022.       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 Oropharynx normal Neck: no thyromegaly CV: RRR no murmurs rubs or gallops Lungs: CTAB no crackles, wheeze, rhonchi Abdomen: soft/nontender/nondistended/normal bowel sounds. No rebound or guarding.  Ext: no edema Skin: warm, dry Neuro: grossly normal, moves all extremities, PERRLA  [employer] [place] work form completed- no concerns from my perspective    Assessment and Plan:  22 y.o. male presenting for annual physical.  Health Maintenance counseling: 1. Anticipatory guidance: Patient counseled regarding regular dental exams -q6 months, eye exams - yearly  in general,  avoiding smoking and second hand smoke , limiting alcohol to 2 beverages per day- doesn't drink, no illicit drugs.   2. Risk factor reduction:  Advised patient of need for regular exercise and diet rich and fruits and vegetables to reduce risk of heart attack and stroke.  Exercise- 3 days a week - walking dog and lifting/moving/etc.  Diet/weight management-roughly stable- wants to be down perhps a few lbs.  Wt Readings from Last 3 Encounters:  03/19/23 167 lb 6.4 oz (75.9 kg)  03/22/22 161 lb 6.4 oz (73.2 kg) (62 %, Z= 0.29)*  06/08/21 163 lb 3.2 oz (74 kg) (68 %, Z= 0.47)*    * Growth percentiles are based on CDC (Boys, 2-20 Years) data.  3. Immunizations/screenings/ancillary studies- Tetanus, Diphtheria, and Pertussis (Tdap) today. Has donated blood within last year so has been screened HCV and HIV Immunization History  Administered Date(s) Administered   COVID-19, mRNA, vaccine(Comirnaty)12 years and older 10/24/2022   HPV 9-valent 02/08/2015, 06/15/2015, 11/02/2015   Influenza,inj,Quad PF,6+ Mos 10/28/2014, 09/07/2015, 08/03/2016, 07/13/2017, 10/27/2022   PFIZER(Purple Top)SARS-COV-2 Vaccination 01/16/2020, 02/10/2020, 10/19/2020   Tdap 12/27/2012  4. Prostate cancer screening- no first degree relative with history- start at 55   5. Colon cancer screening - no family history, start at age 70  6. Skin cancer screening/prevention- no dermatologist. advised regular sunscreen use. Denies worrisome, changing, or new skin lesions.  7. Testicular cancer screening- advised monthly self exams  8. STD screening- patient opts out -abstinence at the moment - does not need 9. Smoking associated screening- never smoker  Status of chronic or acute concerns   #screening hyperlipidemia and baseline bloodwork- prefers to do at a future physical- opts out for now  #attention deficit disorder- still on Adderall through psychiatry. Also on sertraline for anxiety throught hematology- reports working well  #acne- does well when uses benzaclin but has run out- needs refill- mainly just needs at night  #GERD- remains on omeprazole 40 mg -if we check labs in future may need to check B12 with long term proton pump inhibitor (PPI) stomach acid reducer use   Recommended follow up: Return in about 1 year (around 03/18/2024) for physical or sooner if needed.Schedule b4 you leave.  Lab/Order associations:declines labs   ICD-10-CM   1. Preventative health care  Z00.00      Meds ordered this encounter  Medications   clindamycin-benzoyl peroxide (BENZACLIN) gel    Sig: Apply  topically 2 (two) times daily.    Dispense:  25 g    Refill:  5    Return precautions advised.   Tana Conch, MD

## 2023-10-16 ENCOUNTER — Other Ambulatory Visit: Payer: Self-pay | Admitting: Family Medicine

## 2023-10-17 DIAGNOSIS — F902 Attention-deficit hyperactivity disorder, combined type: Secondary | ICD-10-CM | POA: Diagnosis not present

## 2023-10-17 DIAGNOSIS — F411 Generalized anxiety disorder: Secondary | ICD-10-CM | POA: Diagnosis not present

## 2023-10-21 DIAGNOSIS — F902 Attention-deficit hyperactivity disorder, combined type: Secondary | ICD-10-CM | POA: Diagnosis not present

## 2023-10-21 DIAGNOSIS — F330 Major depressive disorder, recurrent, mild: Secondary | ICD-10-CM | POA: Diagnosis not present

## 2023-10-21 DIAGNOSIS — F411 Generalized anxiety disorder: Secondary | ICD-10-CM | POA: Diagnosis not present

## 2023-10-22 DIAGNOSIS — F330 Major depressive disorder, recurrent, mild: Secondary | ICD-10-CM | POA: Diagnosis not present

## 2023-10-22 DIAGNOSIS — F411 Generalized anxiety disorder: Secondary | ICD-10-CM | POA: Diagnosis not present

## 2023-10-29 DIAGNOSIS — F902 Attention-deficit hyperactivity disorder, combined type: Secondary | ICD-10-CM | POA: Diagnosis not present

## 2023-10-29 DIAGNOSIS — F411 Generalized anxiety disorder: Secondary | ICD-10-CM | POA: Diagnosis not present

## 2023-10-29 DIAGNOSIS — F330 Major depressive disorder, recurrent, mild: Secondary | ICD-10-CM | POA: Diagnosis not present

## 2023-11-22 ENCOUNTER — Other Ambulatory Visit (HOSPITAL_COMMUNITY): Payer: Self-pay

## 2024-03-19 DIAGNOSIS — F330 Major depressive disorder, recurrent, mild: Secondary | ICD-10-CM | POA: Diagnosis not present

## 2024-03-19 DIAGNOSIS — F411 Generalized anxiety disorder: Secondary | ICD-10-CM | POA: Diagnosis not present

## 2024-03-19 DIAGNOSIS — F902 Attention-deficit hyperactivity disorder, combined type: Secondary | ICD-10-CM | POA: Diagnosis not present

## 2024-04-17 DIAGNOSIS — F411 Generalized anxiety disorder: Secondary | ICD-10-CM | POA: Diagnosis not present

## 2024-04-17 DIAGNOSIS — F902 Attention-deficit hyperactivity disorder, combined type: Secondary | ICD-10-CM | POA: Diagnosis not present

## 2024-04-17 DIAGNOSIS — F330 Major depressive disorder, recurrent, mild: Secondary | ICD-10-CM | POA: Diagnosis not present

## 2024-04-17 DIAGNOSIS — F4011 Social phobia, generalized: Secondary | ICD-10-CM | POA: Diagnosis not present

## 2024-04-21 ENCOUNTER — Other Ambulatory Visit (HOSPITAL_COMMUNITY): Payer: Self-pay

## 2024-04-27 DIAGNOSIS — F411 Generalized anxiety disorder: Secondary | ICD-10-CM | POA: Diagnosis not present

## 2024-04-27 DIAGNOSIS — F4011 Social phobia, generalized: Secondary | ICD-10-CM | POA: Diagnosis not present

## 2024-04-27 DIAGNOSIS — F902 Attention-deficit hyperactivity disorder, combined type: Secondary | ICD-10-CM | POA: Diagnosis not present

## 2024-04-27 DIAGNOSIS — F330 Major depressive disorder, recurrent, mild: Secondary | ICD-10-CM | POA: Diagnosis not present

## 2024-05-14 DIAGNOSIS — F411 Generalized anxiety disorder: Secondary | ICD-10-CM | POA: Diagnosis not present

## 2024-05-14 DIAGNOSIS — F4011 Social phobia, generalized: Secondary | ICD-10-CM | POA: Diagnosis not present

## 2024-05-14 DIAGNOSIS — F902 Attention-deficit hyperactivity disorder, combined type: Secondary | ICD-10-CM | POA: Diagnosis not present

## 2024-05-14 DIAGNOSIS — F330 Major depressive disorder, recurrent, mild: Secondary | ICD-10-CM | POA: Diagnosis not present

## 2024-06-04 ENCOUNTER — Other Ambulatory Visit (HOSPITAL_COMMUNITY): Payer: Self-pay

## 2024-06-04 DIAGNOSIS — F411 Generalized anxiety disorder: Secondary | ICD-10-CM | POA: Diagnosis not present

## 2024-06-04 DIAGNOSIS — F4011 Social phobia, generalized: Secondary | ICD-10-CM | POA: Diagnosis not present

## 2024-06-04 DIAGNOSIS — F330 Major depressive disorder, recurrent, mild: Secondary | ICD-10-CM | POA: Diagnosis not present

## 2024-06-04 DIAGNOSIS — F902 Attention-deficit hyperactivity disorder, combined type: Secondary | ICD-10-CM | POA: Diagnosis not present

## 2024-06-04 MED ORDER — AMPHETAMINE-DEXTROAMPHETAMINE 20 MG PO TABS
20.0000 mg | ORAL_TABLET | ORAL | 0 refills | Status: AC
  Filled 2024-06-04: qty 75, 30d supply, fill #0

## 2024-06-04 MED ORDER — VENLAFAXINE HCL ER 37.5 MG PO CP24
37.5000 mg | ORAL_CAPSULE | ORAL | 0 refills | Status: AC
  Filled 2024-06-04: qty 90, 90d supply, fill #0

## 2024-06-04 MED ORDER — VENLAFAXINE HCL ER 75 MG PO CP24
75.0000 mg | ORAL_CAPSULE | Freq: Every day | ORAL | 0 refills | Status: AC
  Filled 2024-06-04: qty 90, 90d supply, fill #0

## 2024-06-04 MED ORDER — BUSPIRONE HCL 10 MG PO TABS
10.0000 mg | ORAL_TABLET | Freq: Two times a day (BID) | ORAL | 0 refills | Status: AC
  Filled 2024-06-04: qty 180, 90d supply, fill #0

## 2024-09-12 ENCOUNTER — Other Ambulatory Visit (HOSPITAL_COMMUNITY): Payer: Self-pay

## 2024-09-24 ENCOUNTER — Other Ambulatory Visit (HOSPITAL_BASED_OUTPATIENT_CLINIC_OR_DEPARTMENT_OTHER): Payer: Self-pay

## 2024-09-24 MED ORDER — VENLAFAXINE HCL ER 37.5 MG PO CP24
37.5000 mg | ORAL_CAPSULE | Freq: Every day | ORAL | 0 refills | Status: AC
  Filled 2024-09-24 – 2024-09-26 (×2): qty 30, 30d supply, fill #0

## 2024-09-24 MED ORDER — AMPHETAMINE-DEXTROAMPHETAMINE 20 MG PO TABS
ORAL_TABLET | ORAL | 0 refills | Status: AC
  Filled 2024-09-24: qty 75, 30d supply, fill #0

## 2024-09-24 MED ORDER — BUSPIRONE HCL 10 MG PO TABS
10.0000 mg | ORAL_TABLET | Freq: Two times a day (BID) | ORAL | 0 refills | Status: AC
  Filled 2024-09-24: qty 60, 30d supply, fill #0

## 2024-09-24 MED ORDER — VENLAFAXINE HCL ER 75 MG PO CP24
75.0000 mg | ORAL_CAPSULE | Freq: Every day | ORAL | 0 refills | Status: AC
Start: 2024-09-24 — End: ?
  Filled 2024-09-24: qty 30, 30d supply, fill #0

## 2024-09-26 ENCOUNTER — Other Ambulatory Visit (HOSPITAL_BASED_OUTPATIENT_CLINIC_OR_DEPARTMENT_OTHER): Payer: Self-pay

## 2024-09-26 ENCOUNTER — Other Ambulatory Visit (HOSPITAL_COMMUNITY): Payer: Self-pay
